# Patient Record
Sex: Male | Born: 1955 | Race: White | Hispanic: No
Health system: Southern US, Community
[De-identification: ages and names within clinical notes are randomized; demographics above are authoritative.]

---

## 1999-03-16 ENCOUNTER — Emergency Department (HOSPITAL_COMMUNITY): Admission: EM | Admit: 1999-03-16 | Discharge: 1999-03-16 | Payer: Self-pay | Admitting: Emergency Medicine

## 1999-03-19 ENCOUNTER — Emergency Department (HOSPITAL_COMMUNITY): Admission: EM | Admit: 1999-03-19 | Discharge: 1999-03-19 | Payer: Self-pay | Admitting: Emergency Medicine

## 1999-03-23 ENCOUNTER — Encounter (HOSPITAL_COMMUNITY): Admission: RE | Admit: 1999-03-23 | Discharge: 1999-06-21 | Payer: Self-pay

## 2006-04-08 ENCOUNTER — Emergency Department (HOSPITAL_COMMUNITY): Admission: EM | Admit: 2006-04-08 | Discharge: 2006-04-08 | Payer: Self-pay | Admitting: Emergency Medicine

## 2007-02-25 ENCOUNTER — Encounter: Admission: RE | Admit: 2007-02-25 | Discharge: 2007-02-25 | Payer: Self-pay | Admitting: Orthopedic Surgery

## 2008-04-21 IMAGING — RF DG FLUORO GUIDE NDL PLC/BX
2 series · 2 of 2 positions shown · IV contrast (gadolinium)
Comparison: none

CLINICAL DATA: Left hip pain for MRI.
 LEFT HIP ARTHROGRAM: 
 The left anterior hip region was prepped and draped in a sterile fashion and Lidocaine was utilized for local anesthesia.  Under fluoroscopic guidance, a 22 gauge spinal needle was inserted into the left hip joint.  Iodinated contrast, Lidocaine and gadolinium were injected without complication.

[Series 1: (hospital) · 1 of 1 slices shown (1 of 2)]
[im 1/1]
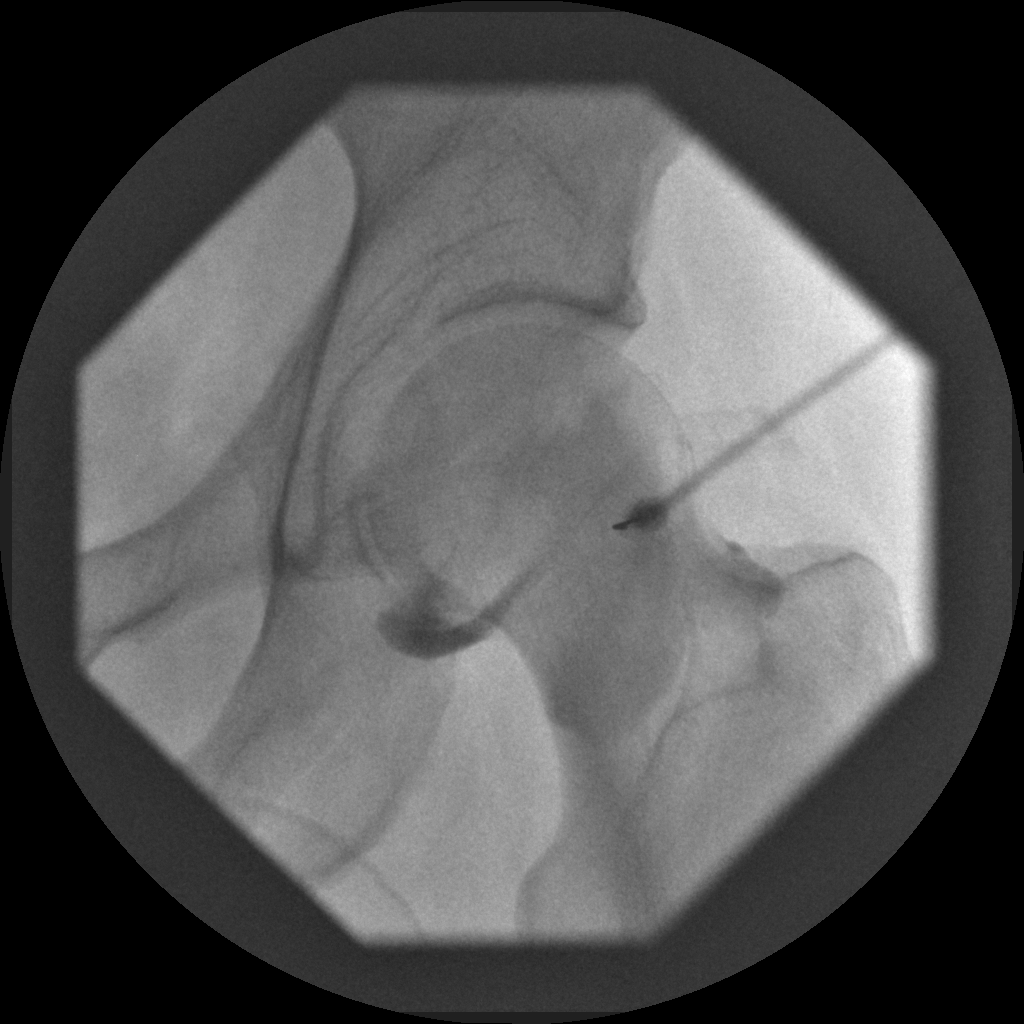

[Series 2: (hospital) · 1 of 1 slices shown (2 of 2)]
[im 1/1]
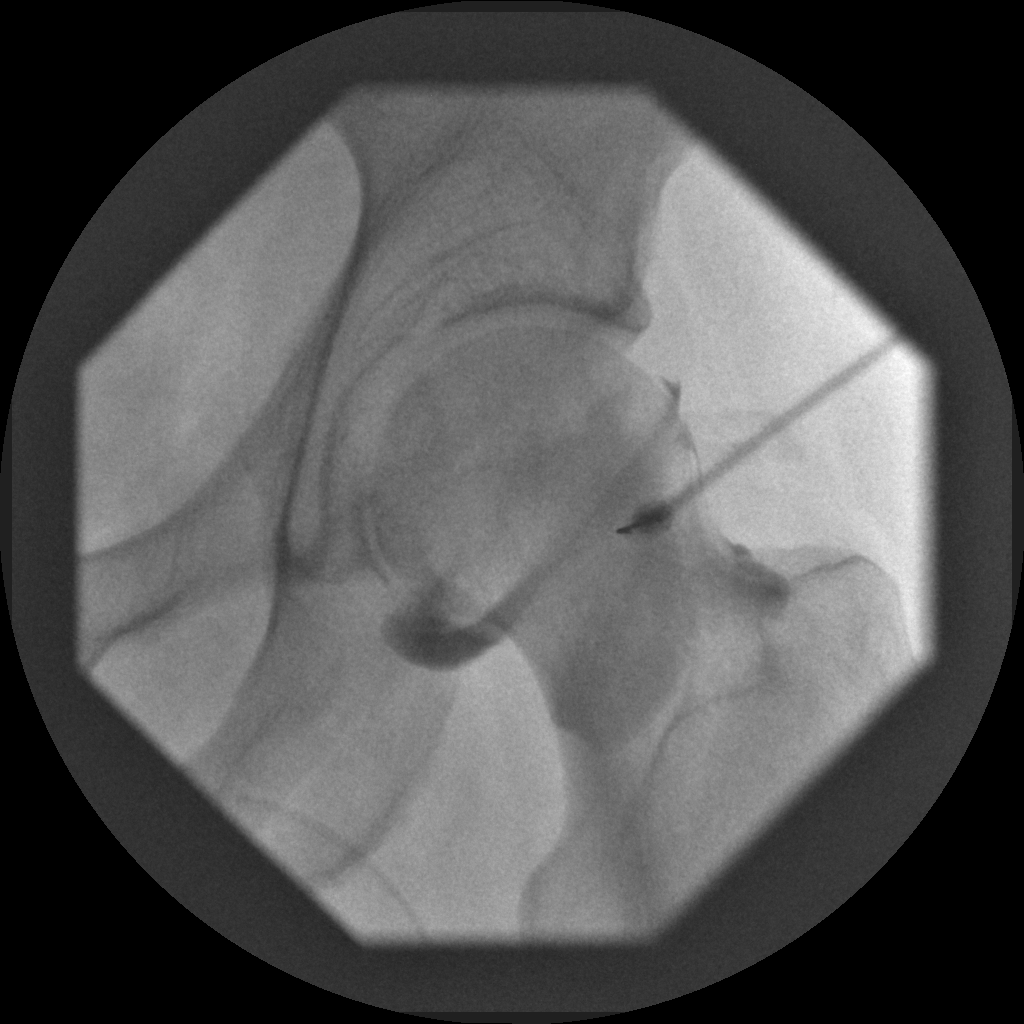

[2 of 2 positions shown; findings below may reference images not displayed]

FINDINGS: Images document needle placement in the hip joint on the left.
IMPRESSION: Left hip arthrogram in preparation for MRI.

## 2020-05-09 DIAGNOSIS — Z1212 Encounter for screening for malignant neoplasm of rectum: Secondary | ICD-10-CM | POA: Diagnosis not present

## 2020-05-09 DIAGNOSIS — Z1211 Encounter for screening for malignant neoplasm of colon: Secondary | ICD-10-CM | POA: Diagnosis not present

## 2020-05-18 DIAGNOSIS — N200 Calculus of kidney: Secondary | ICD-10-CM | POA: Diagnosis not present

## 2020-05-26 DIAGNOSIS — E538 Deficiency of other specified B group vitamins: Secondary | ICD-10-CM | POA: Diagnosis not present

## 2020-05-26 DIAGNOSIS — R4789 Other speech disturbances: Secondary | ICD-10-CM | POA: Diagnosis not present

## 2020-05-28 DIAGNOSIS — N200 Calculus of kidney: Secondary | ICD-10-CM | POA: Diagnosis not present

## 2020-05-31 DIAGNOSIS — E538 Deficiency of other specified B group vitamins: Secondary | ICD-10-CM | POA: Diagnosis not present

## 2020-06-11 DIAGNOSIS — N200 Calculus of kidney: Secondary | ICD-10-CM | POA: Diagnosis not present

## 2020-07-05 DIAGNOSIS — N200 Calculus of kidney: Secondary | ICD-10-CM | POA: Diagnosis not present

## 2020-07-05 DIAGNOSIS — Z09 Encounter for follow-up examination after completed treatment for conditions other than malignant neoplasm: Secondary | ICD-10-CM | POA: Diagnosis not present

## 2020-07-05 DIAGNOSIS — K8020 Calculus of gallbladder without cholecystitis without obstruction: Secondary | ICD-10-CM | POA: Diagnosis not present

## 2020-07-26 DIAGNOSIS — N200 Calculus of kidney: Secondary | ICD-10-CM | POA: Diagnosis not present

## 2020-07-28 DIAGNOSIS — N201 Calculus of ureter: Secondary | ICD-10-CM | POA: Diagnosis not present

## 2020-08-02 DIAGNOSIS — N201 Calculus of ureter: Secondary | ICD-10-CM | POA: Diagnosis not present

## 2020-08-10 DIAGNOSIS — G232 Striatonigral degeneration: Secondary | ICD-10-CM | POA: Diagnosis not present

## 2020-08-23 DIAGNOSIS — G20 Parkinson's disease: Secondary | ICD-10-CM | POA: Diagnosis not present

## 2020-08-23 DIAGNOSIS — G232 Striatonigral degeneration: Secondary | ICD-10-CM | POA: Diagnosis not present

## 2020-08-25 DIAGNOSIS — N201 Calculus of ureter: Secondary | ICD-10-CM | POA: Diagnosis not present

## 2020-08-25 DIAGNOSIS — N200 Calculus of kidney: Secondary | ICD-10-CM | POA: Diagnosis not present

## 2020-08-25 DIAGNOSIS — N202 Calculus of kidney with calculus of ureter: Secondary | ICD-10-CM | POA: Diagnosis not present

## 2020-08-27 DIAGNOSIS — G232 Striatonigral degeneration: Secondary | ICD-10-CM | POA: Diagnosis not present

## 2020-09-22 DIAGNOSIS — N200 Calculus of kidney: Secondary | ICD-10-CM | POA: Diagnosis not present

## 2020-09-22 DIAGNOSIS — N201 Calculus of ureter: Secondary | ICD-10-CM | POA: Diagnosis not present

## 2020-09-22 DIAGNOSIS — R3915 Urgency of urination: Secondary | ICD-10-CM | POA: Diagnosis not present

## 2020-09-22 DIAGNOSIS — R350 Frequency of micturition: Secondary | ICD-10-CM | POA: Diagnosis not present

## 2020-09-22 DIAGNOSIS — N202 Calculus of kidney with calculus of ureter: Secondary | ICD-10-CM | POA: Diagnosis not present

## 2020-10-06 DIAGNOSIS — R0683 Snoring: Secondary | ICD-10-CM | POA: Diagnosis not present

## 2020-10-06 DIAGNOSIS — N202 Calculus of kidney with calculus of ureter: Secondary | ICD-10-CM | POA: Diagnosis not present

## 2020-10-06 DIAGNOSIS — Z9189 Other specified personal risk factors, not elsewhere classified: Secondary | ICD-10-CM | POA: Diagnosis not present

## 2020-10-06 DIAGNOSIS — Z87891 Personal history of nicotine dependence: Secondary | ICD-10-CM | POA: Diagnosis not present

## 2020-10-06 DIAGNOSIS — I10 Essential (primary) hypertension: Secondary | ICD-10-CM | POA: Diagnosis not present

## 2020-10-06 DIAGNOSIS — G20 Parkinson's disease: Secondary | ICD-10-CM | POA: Diagnosis not present

## 2020-10-06 DIAGNOSIS — R7303 Prediabetes: Secondary | ICD-10-CM | POA: Diagnosis not present

## 2020-10-18 DIAGNOSIS — Z87891 Personal history of nicotine dependence: Secondary | ICD-10-CM | POA: Diagnosis not present

## 2020-10-18 DIAGNOSIS — I10 Essential (primary) hypertension: Secondary | ICD-10-CM | POA: Diagnosis not present

## 2020-10-18 DIAGNOSIS — N202 Calculus of kidney with calculus of ureter: Secondary | ICD-10-CM | POA: Diagnosis not present

## 2020-10-18 DIAGNOSIS — R7303 Prediabetes: Secondary | ICD-10-CM | POA: Diagnosis not present

## 2020-10-18 DIAGNOSIS — G20 Parkinson's disease: Secondary | ICD-10-CM | POA: Diagnosis not present

## 2020-10-18 DIAGNOSIS — N201 Calculus of ureter: Secondary | ICD-10-CM | POA: Diagnosis not present

## 2020-11-02 DIAGNOSIS — Z960 Presence of urogenital implants: Secondary | ICD-10-CM | POA: Diagnosis not present

## 2020-11-19 DIAGNOSIS — R4789 Other speech disturbances: Secondary | ICD-10-CM | POA: Diagnosis not present

## 2020-11-19 DIAGNOSIS — R454 Irritability and anger: Secondary | ICD-10-CM | POA: Diagnosis not present

## 2020-11-19 DIAGNOSIS — G20 Parkinson's disease: Secondary | ICD-10-CM | POA: Diagnosis not present

## 2020-11-19 DIAGNOSIS — Z79899 Other long term (current) drug therapy: Secondary | ICD-10-CM | POA: Diagnosis not present

## 2020-11-19 DIAGNOSIS — R498 Other voice and resonance disorders: Secondary | ICD-10-CM | POA: Diagnosis not present

## 2020-11-19 DIAGNOSIS — R1310 Dysphagia, unspecified: Secondary | ICD-10-CM | POA: Diagnosis not present

## 2020-11-30 DIAGNOSIS — Z7409 Other reduced mobility: Secondary | ICD-10-CM | POA: Diagnosis not present

## 2020-11-30 DIAGNOSIS — G20 Parkinson's disease: Secondary | ICD-10-CM | POA: Diagnosis not present

## 2020-11-30 DIAGNOSIS — R262 Difficulty in walking, not elsewhere classified: Secondary | ICD-10-CM | POA: Diagnosis not present

## 2020-12-06 DIAGNOSIS — R633 Feeding difficulties, unspecified: Secondary | ICD-10-CM | POA: Diagnosis not present

## 2020-12-06 DIAGNOSIS — R1310 Dysphagia, unspecified: Secondary | ICD-10-CM | POA: Diagnosis not present

## 2020-12-06 DIAGNOSIS — R1312 Dysphagia, oropharyngeal phase: Secondary | ICD-10-CM | POA: Diagnosis not present

## 2020-12-15 DIAGNOSIS — N200 Calculus of kidney: Secondary | ICD-10-CM | POA: Diagnosis not present

## 2020-12-16 DIAGNOSIS — R278 Other lack of coordination: Secondary | ICD-10-CM | POA: Diagnosis not present

## 2020-12-16 DIAGNOSIS — G20 Parkinson's disease: Secondary | ICD-10-CM | POA: Diagnosis not present

## 2020-12-16 DIAGNOSIS — G232 Striatonigral degeneration: Secondary | ICD-10-CM | POA: Diagnosis not present

## 2020-12-16 DIAGNOSIS — Z7409 Other reduced mobility: Secondary | ICD-10-CM | POA: Diagnosis not present

## 2020-12-16 DIAGNOSIS — Z789 Other specified health status: Secondary | ICD-10-CM | POA: Diagnosis not present

## 2021-01-03 DIAGNOSIS — R262 Difficulty in walking, not elsewhere classified: Secondary | ICD-10-CM | POA: Diagnosis not present

## 2021-01-03 DIAGNOSIS — Z789 Other specified health status: Secondary | ICD-10-CM | POA: Diagnosis not present

## 2021-01-03 DIAGNOSIS — R278 Other lack of coordination: Secondary | ICD-10-CM | POA: Diagnosis not present

## 2021-01-03 DIAGNOSIS — G232 Striatonigral degeneration: Secondary | ICD-10-CM | POA: Diagnosis not present

## 2021-01-03 DIAGNOSIS — G20 Parkinson's disease: Secondary | ICD-10-CM | POA: Diagnosis not present

## 2021-01-03 DIAGNOSIS — Z7409 Other reduced mobility: Secondary | ICD-10-CM | POA: Diagnosis not present

## 2021-01-06 DIAGNOSIS — R262 Difficulty in walking, not elsewhere classified: Secondary | ICD-10-CM | POA: Diagnosis not present

## 2021-01-06 DIAGNOSIS — G20 Parkinson's disease: Secondary | ICD-10-CM | POA: Diagnosis not present

## 2021-01-06 DIAGNOSIS — Z7409 Other reduced mobility: Secondary | ICD-10-CM | POA: Diagnosis not present

## 2021-01-13 DIAGNOSIS — R262 Difficulty in walking, not elsewhere classified: Secondary | ICD-10-CM | POA: Diagnosis not present

## 2021-01-13 DIAGNOSIS — Z7409 Other reduced mobility: Secondary | ICD-10-CM | POA: Diagnosis not present

## 2021-01-13 DIAGNOSIS — G20 Parkinson's disease: Secondary | ICD-10-CM | POA: Diagnosis not present

## 2021-01-20 DIAGNOSIS — R262 Difficulty in walking, not elsewhere classified: Secondary | ICD-10-CM | POA: Diagnosis not present

## 2021-01-20 DIAGNOSIS — G20 Parkinson's disease: Secondary | ICD-10-CM | POA: Diagnosis not present

## 2021-01-20 DIAGNOSIS — Z7409 Other reduced mobility: Secondary | ICD-10-CM | POA: Diagnosis not present

## 2021-01-20 DIAGNOSIS — R278 Other lack of coordination: Secondary | ICD-10-CM | POA: Diagnosis not present

## 2021-01-20 DIAGNOSIS — Z789 Other specified health status: Secondary | ICD-10-CM | POA: Diagnosis not present

## 2021-01-25 DIAGNOSIS — G20 Parkinson's disease: Secondary | ICD-10-CM | POA: Diagnosis not present

## 2021-01-25 DIAGNOSIS — R454 Irritability and anger: Secondary | ICD-10-CM | POA: Diagnosis not present

## 2021-01-27 DIAGNOSIS — R262 Difficulty in walking, not elsewhere classified: Secondary | ICD-10-CM | POA: Diagnosis not present

## 2021-01-27 DIAGNOSIS — R278 Other lack of coordination: Secondary | ICD-10-CM | POA: Diagnosis not present

## 2021-01-27 DIAGNOSIS — Z789 Other specified health status: Secondary | ICD-10-CM | POA: Diagnosis not present

## 2021-01-27 DIAGNOSIS — G20 Parkinson's disease: Secondary | ICD-10-CM | POA: Diagnosis not present

## 2021-01-27 DIAGNOSIS — Z7409 Other reduced mobility: Secondary | ICD-10-CM | POA: Diagnosis not present

## 2021-01-31 DIAGNOSIS — J384 Edema of larynx: Secondary | ICD-10-CM | POA: Diagnosis not present

## 2021-01-31 DIAGNOSIS — G20 Parkinson's disease: Secondary | ICD-10-CM | POA: Diagnosis not present

## 2021-01-31 DIAGNOSIS — R490 Dysphonia: Secondary | ICD-10-CM | POA: Diagnosis not present

## 2021-01-31 DIAGNOSIS — R498 Other voice and resonance disorders: Secondary | ICD-10-CM | POA: Diagnosis not present

## 2021-01-31 DIAGNOSIS — J383 Other diseases of vocal cords: Secondary | ICD-10-CM | POA: Diagnosis not present

## 2021-01-31 DIAGNOSIS — G232 Striatonigral degeneration: Secondary | ICD-10-CM | POA: Diagnosis not present

## 2021-01-31 DIAGNOSIS — Z87891 Personal history of nicotine dependence: Secondary | ICD-10-CM | POA: Diagnosis not present

## 2021-02-23 DIAGNOSIS — R1312 Dysphagia, oropharyngeal phase: Secondary | ICD-10-CM | POA: Diagnosis not present

## 2021-02-23 DIAGNOSIS — G20 Parkinson's disease: Secondary | ICD-10-CM | POA: Diagnosis not present

## 2021-02-28 DIAGNOSIS — G20 Parkinson's disease: Secondary | ICD-10-CM | POA: Diagnosis not present

## 2021-03-02 DIAGNOSIS — G20 Parkinson's disease: Secondary | ICD-10-CM | POA: Diagnosis not present

## 2021-03-14 DIAGNOSIS — G20 Parkinson's disease: Secondary | ICD-10-CM | POA: Diagnosis not present

## 2021-03-14 DIAGNOSIS — R471 Dysarthria and anarthria: Secondary | ICD-10-CM | POA: Diagnosis not present

## 2021-03-14 DIAGNOSIS — R498 Other voice and resonance disorders: Secondary | ICD-10-CM | POA: Diagnosis not present

## 2021-03-22 DIAGNOSIS — G20 Parkinson's disease: Secondary | ICD-10-CM | POA: Diagnosis not present

## 2021-03-23 DIAGNOSIS — G20 Parkinson's disease: Secondary | ICD-10-CM | POA: Diagnosis not present

## 2021-03-30 DIAGNOSIS — G20 Parkinson's disease: Secondary | ICD-10-CM | POA: Diagnosis not present

## 2021-04-06 DIAGNOSIS — G20 Parkinson's disease: Secondary | ICD-10-CM | POA: Diagnosis not present

## 2021-04-20 DIAGNOSIS — M5136 Other intervertebral disc degeneration, lumbar region: Secondary | ICD-10-CM | POA: Diagnosis not present

## 2021-04-20 DIAGNOSIS — I10 Essential (primary) hypertension: Secondary | ICD-10-CM | POA: Diagnosis not present

## 2021-04-20 DIAGNOSIS — N200 Calculus of kidney: Secondary | ICD-10-CM | POA: Diagnosis not present

## 2021-04-20 DIAGNOSIS — G20 Parkinson's disease: Secondary | ICD-10-CM | POA: Diagnosis not present

## 2021-05-16 DIAGNOSIS — I10 Essential (primary) hypertension: Secondary | ICD-10-CM | POA: Diagnosis not present

## 2021-05-16 DIAGNOSIS — G8929 Other chronic pain: Secondary | ICD-10-CM | POA: Diagnosis not present

## 2021-05-16 DIAGNOSIS — G20 Parkinson's disease: Secondary | ICD-10-CM | POA: Diagnosis not present

## 2021-06-09 DIAGNOSIS — R1310 Dysphagia, unspecified: Secondary | ICD-10-CM | POA: Diagnosis not present

## 2021-06-15 DIAGNOSIS — Z87442 Personal history of urinary calculi: Secondary | ICD-10-CM | POA: Diagnosis not present

## 2021-06-15 DIAGNOSIS — I10 Essential (primary) hypertension: Secondary | ICD-10-CM | POA: Diagnosis not present

## 2021-06-15 DIAGNOSIS — G20 Parkinson's disease: Secondary | ICD-10-CM | POA: Diagnosis not present

## 2021-06-15 DIAGNOSIS — Z13220 Encounter for screening for lipoid disorders: Secondary | ICD-10-CM | POA: Diagnosis not present

## 2021-06-15 DIAGNOSIS — Z0000 Encounter for general adult medical examination without abnormal findings: Secondary | ICD-10-CM | POA: Diagnosis not present

## 2021-06-15 DIAGNOSIS — Z136 Encounter for screening for cardiovascular disorders: Secondary | ICD-10-CM | POA: Diagnosis not present

## 2021-06-29 DIAGNOSIS — N200 Calculus of kidney: Secondary | ICD-10-CM | POA: Diagnosis not present

## 2021-07-12 DIAGNOSIS — G20 Parkinson's disease: Secondary | ICD-10-CM | POA: Diagnosis not present

## 2021-07-28 DIAGNOSIS — Z79899 Other long term (current) drug therapy: Secondary | ICD-10-CM | POA: Diagnosis not present

## 2021-07-28 DIAGNOSIS — G20 Parkinson's disease: Secondary | ICD-10-CM | POA: Diagnosis not present

## 2021-10-07 DIAGNOSIS — I10 Essential (primary) hypertension: Secondary | ICD-10-CM | POA: Diagnosis not present

## 2021-10-07 DIAGNOSIS — G20 Parkinson's disease: Secondary | ICD-10-CM | POA: Diagnosis not present

## 2021-11-04 DIAGNOSIS — I10 Essential (primary) hypertension: Secondary | ICD-10-CM | POA: Diagnosis not present

## 2022-01-23 DIAGNOSIS — R490 Dysphonia: Secondary | ICD-10-CM | POA: Diagnosis not present

## 2022-01-23 DIAGNOSIS — J383 Other diseases of vocal cords: Secondary | ICD-10-CM | POA: Diagnosis not present

## 2022-01-23 DIAGNOSIS — R1314 Dysphagia, pharyngoesophageal phase: Secondary | ICD-10-CM | POA: Diagnosis not present

## 2022-01-23 DIAGNOSIS — G20 Parkinson's disease: Secondary | ICD-10-CM | POA: Diagnosis not present

## 2022-01-23 DIAGNOSIS — R633 Feeding difficulties, unspecified: Secondary | ICD-10-CM | POA: Diagnosis not present

## 2022-02-15 DIAGNOSIS — G231 Progressive supranuclear ophthalmoplegia [Steele-Richardson-Olszewski]: Secondary | ICD-10-CM | POA: Diagnosis not present

## 2022-02-15 DIAGNOSIS — G20C Parkinsonism, unspecified: Secondary | ICD-10-CM | POA: Diagnosis not present

## 2022-02-15 DIAGNOSIS — R454 Irritability and anger: Secondary | ICD-10-CM | POA: Diagnosis not present

## 2022-02-15 DIAGNOSIS — R296 Repeated falls: Secondary | ICD-10-CM | POA: Diagnosis not present

## 2022-02-15 DIAGNOSIS — Z79899 Other long term (current) drug therapy: Secondary | ICD-10-CM | POA: Diagnosis not present

## 2022-02-24 DIAGNOSIS — I10 Essential (primary) hypertension: Secondary | ICD-10-CM | POA: Diagnosis not present

## 2022-02-24 DIAGNOSIS — G20C Parkinsonism, unspecified: Secondary | ICD-10-CM | POA: Diagnosis not present

## 2022-02-27 DIAGNOSIS — R269 Unspecified abnormalities of gait and mobility: Secondary | ICD-10-CM | POA: Diagnosis not present

## 2022-02-27 DIAGNOSIS — Z7409 Other reduced mobility: Secondary | ICD-10-CM | POA: Diagnosis not present

## 2022-02-27 DIAGNOSIS — R262 Difficulty in walking, not elsewhere classified: Secondary | ICD-10-CM | POA: Diagnosis not present

## 2022-02-27 DIAGNOSIS — G20C Parkinsonism, unspecified: Secondary | ICD-10-CM | POA: Diagnosis not present

## 2022-03-01 DIAGNOSIS — R269 Unspecified abnormalities of gait and mobility: Secondary | ICD-10-CM | POA: Diagnosis not present

## 2022-03-01 DIAGNOSIS — Z7409 Other reduced mobility: Secondary | ICD-10-CM | POA: Diagnosis not present

## 2022-03-01 DIAGNOSIS — G20C Parkinsonism, unspecified: Secondary | ICD-10-CM | POA: Diagnosis not present

## 2022-03-01 DIAGNOSIS — R262 Difficulty in walking, not elsewhere classified: Secondary | ICD-10-CM | POA: Diagnosis not present

## 2022-04-07 DIAGNOSIS — R269 Unspecified abnormalities of gait and mobility: Secondary | ICD-10-CM | POA: Diagnosis not present

## 2022-04-07 DIAGNOSIS — Z7409 Other reduced mobility: Secondary | ICD-10-CM | POA: Diagnosis not present

## 2022-04-07 DIAGNOSIS — R262 Difficulty in walking, not elsewhere classified: Secondary | ICD-10-CM | POA: Diagnosis not present

## 2022-04-07 DIAGNOSIS — G20C Parkinsonism, unspecified: Secondary | ICD-10-CM | POA: Diagnosis not present

## 2022-04-10 DIAGNOSIS — R262 Difficulty in walking, not elsewhere classified: Secondary | ICD-10-CM | POA: Diagnosis not present

## 2022-04-10 DIAGNOSIS — Z7409 Other reduced mobility: Secondary | ICD-10-CM | POA: Diagnosis not present

## 2022-04-10 DIAGNOSIS — G20C Parkinsonism, unspecified: Secondary | ICD-10-CM | POA: Diagnosis not present

## 2022-04-10 DIAGNOSIS — R269 Unspecified abnormalities of gait and mobility: Secondary | ICD-10-CM | POA: Diagnosis not present

## 2022-04-13 DIAGNOSIS — G20C Parkinsonism, unspecified: Secondary | ICD-10-CM | POA: Diagnosis not present

## 2022-04-13 DIAGNOSIS — R269 Unspecified abnormalities of gait and mobility: Secondary | ICD-10-CM | POA: Diagnosis not present

## 2022-04-13 DIAGNOSIS — Z7409 Other reduced mobility: Secondary | ICD-10-CM | POA: Diagnosis not present

## 2022-04-13 DIAGNOSIS — R262 Difficulty in walking, not elsewhere classified: Secondary | ICD-10-CM | POA: Diagnosis not present

## 2022-04-21 DIAGNOSIS — R262 Difficulty in walking, not elsewhere classified: Secondary | ICD-10-CM | POA: Diagnosis not present

## 2022-04-21 DIAGNOSIS — G20C Parkinsonism, unspecified: Secondary | ICD-10-CM | POA: Diagnosis not present

## 2022-04-21 DIAGNOSIS — R269 Unspecified abnormalities of gait and mobility: Secondary | ICD-10-CM | POA: Diagnosis not present

## 2022-04-21 DIAGNOSIS — Z7409 Other reduced mobility: Secondary | ICD-10-CM | POA: Diagnosis not present

## 2022-04-24 DIAGNOSIS — Z7409 Other reduced mobility: Secondary | ICD-10-CM | POA: Diagnosis not present

## 2022-04-24 DIAGNOSIS — R269 Unspecified abnormalities of gait and mobility: Secondary | ICD-10-CM | POA: Diagnosis not present

## 2022-04-24 DIAGNOSIS — G20C Parkinsonism, unspecified: Secondary | ICD-10-CM | POA: Diagnosis not present

## 2022-04-24 DIAGNOSIS — R262 Difficulty in walking, not elsewhere classified: Secondary | ICD-10-CM | POA: Diagnosis not present

## 2022-04-27 DIAGNOSIS — G20C Parkinsonism, unspecified: Secondary | ICD-10-CM | POA: Diagnosis not present

## 2022-04-27 DIAGNOSIS — R262 Difficulty in walking, not elsewhere classified: Secondary | ICD-10-CM | POA: Diagnosis not present

## 2022-04-27 DIAGNOSIS — Z7409 Other reduced mobility: Secondary | ICD-10-CM | POA: Diagnosis not present

## 2022-04-27 DIAGNOSIS — R269 Unspecified abnormalities of gait and mobility: Secondary | ICD-10-CM | POA: Diagnosis not present

## 2022-05-09 ENCOUNTER — Telehealth: Payer: Self-pay | Admitting: *Deleted

## 2022-05-09 NOTE — Patient Outreach (Signed)
  Care Coordination   05/09/2022 Name: [name] MRN: [id number] DOB: 05-May-1956   Care Coordination Outreach Attempts:  An unsuccessful telephone outreach was attempted today to offer the patient information about available care coordination services as a benefit of their health plan.   Follow Up Plan:  Additional outreach attempts will be made to offer the patient care coordination information and services.   Encounter Outcome:  No Answer   Care Coordination Interventions:  No, not indicated    Raina Mina, RN Care Management Coordinator [place] Office [phone]

## 2022-05-10 DIAGNOSIS — W01198A Fall on same level from slipping, tripping and stumbling with subsequent striking against other object, initial encounter: Secondary | ICD-10-CM | POA: Diagnosis not present

## 2022-05-10 DIAGNOSIS — H1131 Conjunctival hemorrhage, right eye: Secondary | ICD-10-CM | POA: Diagnosis not present

## 2022-06-13 DIAGNOSIS — R262 Difficulty in walking, not elsewhere classified: Secondary | ICD-10-CM | POA: Diagnosis not present

## 2022-06-13 DIAGNOSIS — G20C Parkinsonism, unspecified: Secondary | ICD-10-CM | POA: Diagnosis not present

## 2022-06-13 DIAGNOSIS — Z7409 Other reduced mobility: Secondary | ICD-10-CM | POA: Diagnosis not present

## 2022-06-13 DIAGNOSIS — R269 Unspecified abnormalities of gait and mobility: Secondary | ICD-10-CM | POA: Diagnosis not present

## 2022-06-23 DIAGNOSIS — Z7409 Other reduced mobility: Secondary | ICD-10-CM | POA: Diagnosis not present

## 2022-06-23 DIAGNOSIS — G20C Parkinsonism, unspecified: Secondary | ICD-10-CM | POA: Diagnosis not present

## 2022-06-23 DIAGNOSIS — R269 Unspecified abnormalities of gait and mobility: Secondary | ICD-10-CM | POA: Diagnosis not present

## 2022-06-23 DIAGNOSIS — R262 Difficulty in walking, not elsewhere classified: Secondary | ICD-10-CM | POA: Diagnosis not present

## 2022-06-27 DIAGNOSIS — R269 Unspecified abnormalities of gait and mobility: Secondary | ICD-10-CM | POA: Diagnosis not present

## 2022-06-27 DIAGNOSIS — R262 Difficulty in walking, not elsewhere classified: Secondary | ICD-10-CM | POA: Diagnosis not present

## 2022-06-27 DIAGNOSIS — G20C Parkinsonism, unspecified: Secondary | ICD-10-CM | POA: Diagnosis not present

## 2022-06-27 DIAGNOSIS — Z7409 Other reduced mobility: Secondary | ICD-10-CM | POA: Diagnosis not present

## 2022-07-17 DIAGNOSIS — R633 Feeding difficulties, unspecified: Secondary | ICD-10-CM | POA: Diagnosis not present

## 2022-07-17 DIAGNOSIS — R1312 Dysphagia, oropharyngeal phase: Secondary | ICD-10-CM | POA: Diagnosis not present

## 2022-07-17 DIAGNOSIS — G20A1 Parkinson's disease without dyskinesia, without mention of fluctuations: Secondary | ICD-10-CM | POA: Diagnosis not present

## 2022-07-17 DIAGNOSIS — R1310 Dysphagia, unspecified: Secondary | ICD-10-CM | POA: Diagnosis not present

## 2022-07-17 DIAGNOSIS — D649 Anemia, unspecified: Secondary | ICD-10-CM | POA: Diagnosis not present

## 2022-07-17 DIAGNOSIS — R6339 Other feeding difficulties: Secondary | ICD-10-CM | POA: Diagnosis not present

## 2022-07-21 DIAGNOSIS — N200 Calculus of kidney: Secondary | ICD-10-CM | POA: Diagnosis not present

## 2022-08-18 DIAGNOSIS — G20C Parkinsonism, unspecified: Secondary | ICD-10-CM | POA: Diagnosis not present

## 2022-08-23 DIAGNOSIS — S0990XA Unspecified injury of head, initial encounter: Secondary | ICD-10-CM | POA: Diagnosis not present

## 2022-08-23 DIAGNOSIS — S0001XA Abrasion of scalp, initial encounter: Secondary | ICD-10-CM | POA: Diagnosis not present

## 2022-08-23 DIAGNOSIS — S0003XA Contusion of scalp, initial encounter: Secondary | ICD-10-CM | POA: Diagnosis not present

## 2022-08-23 DIAGNOSIS — S199XXA Unspecified injury of neck, initial encounter: Secondary | ICD-10-CM | POA: Diagnosis not present

## 2022-08-23 DIAGNOSIS — Z23 Encounter for immunization: Secondary | ICD-10-CM | POA: Diagnosis not present

## 2022-08-23 DIAGNOSIS — W1830XA Fall on same level, unspecified, initial encounter: Secondary | ICD-10-CM | POA: Diagnosis not present

## 2022-11-10 DIAGNOSIS — R269 Unspecified abnormalities of gait and mobility: Secondary | ICD-10-CM | POA: Diagnosis not present

## 2022-11-10 DIAGNOSIS — G20C Parkinsonism, unspecified: Secondary | ICD-10-CM | POA: Diagnosis not present

## 2022-11-10 DIAGNOSIS — Z7409 Other reduced mobility: Secondary | ICD-10-CM | POA: Diagnosis not present

## 2022-12-21 DIAGNOSIS — G20C Parkinsonism, unspecified: Secondary | ICD-10-CM | POA: Diagnosis not present

## 2022-12-21 DIAGNOSIS — R269 Unspecified abnormalities of gait and mobility: Secondary | ICD-10-CM | POA: Diagnosis not present

## 2022-12-21 DIAGNOSIS — Z7409 Other reduced mobility: Secondary | ICD-10-CM | POA: Diagnosis not present

## 2023-01-03 DIAGNOSIS — Z9181 History of falling: Secondary | ICD-10-CM | POA: Diagnosis not present

## 2023-01-03 DIAGNOSIS — R634 Abnormal weight loss: Secondary | ICD-10-CM | POA: Diagnosis not present

## 2023-01-03 DIAGNOSIS — F32A Depression, unspecified: Secondary | ICD-10-CM | POA: Diagnosis not present

## 2023-01-03 DIAGNOSIS — I10 Essential (primary) hypertension: Secondary | ICD-10-CM | POA: Diagnosis not present

## 2023-01-03 DIAGNOSIS — Z0000 Encounter for general adult medical examination without abnormal findings: Secondary | ICD-10-CM | POA: Diagnosis not present

## 2023-01-03 DIAGNOSIS — Z13220 Encounter for screening for lipoid disorders: Secondary | ICD-10-CM | POA: Diagnosis not present

## 2023-01-03 DIAGNOSIS — Z136 Encounter for screening for cardiovascular disorders: Secondary | ICD-10-CM | POA: Diagnosis not present

## 2023-01-03 DIAGNOSIS — Z23 Encounter for immunization: Secondary | ICD-10-CM | POA: Diagnosis not present

## 2023-01-04 DIAGNOSIS — G20C Parkinsonism, unspecified: Secondary | ICD-10-CM | POA: Diagnosis not present

## 2023-01-04 DIAGNOSIS — Z7409 Other reduced mobility: Secondary | ICD-10-CM | POA: Diagnosis not present

## 2023-01-04 DIAGNOSIS — R269 Unspecified abnormalities of gait and mobility: Secondary | ICD-10-CM | POA: Diagnosis not present

## 2023-01-11 DIAGNOSIS — R269 Unspecified abnormalities of gait and mobility: Secondary | ICD-10-CM | POA: Diagnosis not present

## 2023-01-11 DIAGNOSIS — G20C Parkinsonism, unspecified: Secondary | ICD-10-CM | POA: Diagnosis not present

## 2023-01-11 DIAGNOSIS — Z7409 Other reduced mobility: Secondary | ICD-10-CM | POA: Diagnosis not present

## 2023-01-18 DIAGNOSIS — R269 Unspecified abnormalities of gait and mobility: Secondary | ICD-10-CM | POA: Diagnosis not present

## 2023-01-18 DIAGNOSIS — G20C Parkinsonism, unspecified: Secondary | ICD-10-CM | POA: Diagnosis not present

## 2023-01-18 DIAGNOSIS — Z7409 Other reduced mobility: Secondary | ICD-10-CM | POA: Diagnosis not present

## 2023-01-29 DIAGNOSIS — J383 Other diseases of vocal cords: Secondary | ICD-10-CM | POA: Diagnosis not present

## 2023-01-29 DIAGNOSIS — G20A1 Parkinson's disease without dyskinesia, without mention of fluctuations: Secondary | ICD-10-CM | POA: Diagnosis not present

## 2023-01-29 DIAGNOSIS — R490 Dysphonia: Secondary | ICD-10-CM | POA: Diagnosis not present

## 2023-02-01 DIAGNOSIS — Z7409 Other reduced mobility: Secondary | ICD-10-CM | POA: Diagnosis not present

## 2023-02-01 DIAGNOSIS — G20C Parkinsonism, unspecified: Secondary | ICD-10-CM | POA: Diagnosis not present

## 2023-02-01 DIAGNOSIS — R269 Unspecified abnormalities of gait and mobility: Secondary | ICD-10-CM | POA: Diagnosis not present

## 2023-02-21 DIAGNOSIS — G479 Sleep disorder, unspecified: Secondary | ICD-10-CM | POA: Diagnosis not present

## 2023-02-21 DIAGNOSIS — G20C Parkinsonism, unspecified: Secondary | ICD-10-CM | POA: Diagnosis not present

## 2023-04-18 DIAGNOSIS — I10 Essential (primary) hypertension: Secondary | ICD-10-CM | POA: Diagnosis not present

## 2023-04-18 DIAGNOSIS — G20B2 Parkinson's disease with dyskinesia, with fluctuations: Secondary | ICD-10-CM | POA: Diagnosis not present

## 2023-05-22 DIAGNOSIS — I82462 Acute embolism and thrombosis of left calf muscular vein: Secondary | ICD-10-CM | POA: Diagnosis not present

## 2023-05-22 DIAGNOSIS — N200 Calculus of kidney: Secondary | ICD-10-CM | POA: Diagnosis not present

## 2023-05-22 DIAGNOSIS — R509 Fever, unspecified: Secondary | ICD-10-CM | POA: Diagnosis not present

## 2023-05-22 DIAGNOSIS — E119 Type 2 diabetes mellitus without complications: Secondary | ICD-10-CM | POA: Diagnosis not present

## 2023-05-22 DIAGNOSIS — Z7401 Bed confinement status: Secondary | ICD-10-CM | POA: Diagnosis not present

## 2023-05-22 DIAGNOSIS — Z20822 Contact with and (suspected) exposure to covid-19: Secondary | ICD-10-CM | POA: Diagnosis not present

## 2023-05-22 DIAGNOSIS — R29898 Other symptoms and signs involving the musculoskeletal system: Secondary | ICD-10-CM | POA: Diagnosis not present

## 2023-05-22 DIAGNOSIS — I639 Cerebral infarction, unspecified: Secondary | ICD-10-CM | POA: Diagnosis not present

## 2023-05-22 DIAGNOSIS — G039 Meningitis, unspecified: Secondary | ICD-10-CM | POA: Diagnosis not present

## 2023-05-22 DIAGNOSIS — G9340 Encephalopathy, unspecified: Secondary | ICD-10-CM | POA: Diagnosis not present

## 2023-05-22 DIAGNOSIS — I82432 Acute embolism and thrombosis of left popliteal vein: Secondary | ICD-10-CM | POA: Diagnosis not present

## 2023-05-22 DIAGNOSIS — I10 Essential (primary) hypertension: Secondary | ICD-10-CM | POA: Diagnosis not present

## 2023-05-22 DIAGNOSIS — I340 Nonrheumatic mitral (valve) insufficiency: Secondary | ICD-10-CM | POA: Diagnosis not present

## 2023-05-22 DIAGNOSIS — R479 Unspecified speech disturbances: Secondary | ICD-10-CM | POA: Diagnosis not present

## 2023-05-22 DIAGNOSIS — G20A1 Parkinson's disease without dyskinesia, without mention of fluctuations: Secondary | ICD-10-CM | POA: Diagnosis not present

## 2023-05-22 DIAGNOSIS — Z4659 Encounter for fitting and adjustment of other gastrointestinal appliance and device: Secondary | ICD-10-CM | POA: Diagnosis not present

## 2023-05-22 DIAGNOSIS — I82412 Acute embolism and thrombosis of left femoral vein: Secondary | ICD-10-CM | POA: Diagnosis not present

## 2023-05-22 DIAGNOSIS — I82452 Acute embolism and thrombosis of left peroneal vein: Secondary | ICD-10-CM | POA: Diagnosis not present

## 2023-05-22 DIAGNOSIS — R9401 Abnormal electroencephalogram [EEG]: Secondary | ICD-10-CM | POA: Diagnosis not present

## 2023-05-22 DIAGNOSIS — R29716 NIHSS score 16: Secondary | ICD-10-CM | POA: Diagnosis not present

## 2023-05-22 DIAGNOSIS — R519 Headache, unspecified: Secondary | ICD-10-CM | POA: Diagnosis not present

## 2023-05-22 DIAGNOSIS — K5900 Constipation, unspecified: Secondary | ICD-10-CM | POA: Diagnosis not present

## 2023-05-22 DIAGNOSIS — G9389 Other specified disorders of brain: Secondary | ICD-10-CM | POA: Diagnosis not present

## 2023-05-22 DIAGNOSIS — R29810 Facial weakness: Secondary | ICD-10-CM | POA: Diagnosis not present

## 2023-05-22 DIAGNOSIS — R918 Other nonspecific abnormal finding of lung field: Secondary | ICD-10-CM | POA: Diagnosis not present

## 2023-05-22 DIAGNOSIS — I63411 Cerebral infarction due to embolism of right middle cerebral artery: Secondary | ICD-10-CM | POA: Diagnosis not present

## 2023-05-22 DIAGNOSIS — R000 Tachycardia, unspecified: Secondary | ICD-10-CM | POA: Diagnosis not present

## 2023-05-22 DIAGNOSIS — Z87442 Personal history of urinary calculi: Secondary | ICD-10-CM | POA: Diagnosis not present

## 2023-05-22 DIAGNOSIS — I82442 Acute embolism and thrombosis of left tibial vein: Secondary | ICD-10-CM | POA: Diagnosis not present

## 2023-05-22 DIAGNOSIS — G231 Progressive supranuclear ophthalmoplegia [Steele-Richardson-Olszewski]: Secondary | ICD-10-CM | POA: Diagnosis not present

## 2023-05-22 DIAGNOSIS — Z7982 Long term (current) use of aspirin: Secondary | ICD-10-CM | POA: Diagnosis not present

## 2023-05-22 DIAGNOSIS — R1319 Other dysphagia: Secondary | ICD-10-CM | POA: Diagnosis not present

## 2023-05-22 DIAGNOSIS — Q2112 Patent foramen ovale: Secondary | ICD-10-CM | POA: Diagnosis not present

## 2023-05-22 DIAGNOSIS — I63311 Cerebral infarction due to thrombosis of right middle cerebral artery: Secondary | ICD-10-CM | POA: Diagnosis not present

## 2024-04-07 DEATH — deceased
